# Patient Record
Sex: Female | Born: 2000 | Race: White | Hispanic: No | Marital: Single | Smoking: Never smoker
Health system: Southern US, Community
[De-identification: ages and names within clinical notes are randomized; demographics above are authoritative.]

---
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
## 2021-02-11 DIAGNOSIS — Z113 Encounter for screening for infections with a predominantly sexual mode of transmission: Secondary | ICD-10-CM | POA: Diagnosis not present

## 2021-02-11 DIAGNOSIS — Z01411 Encounter for gynecological examination (general) (routine) with abnormal findings: Secondary | ICD-10-CM | POA: Diagnosis not present

## 2021-02-11 DIAGNOSIS — Z01419 Encounter for gynecological examination (general) (routine) without abnormal findings: Secondary | ICD-10-CM | POA: Diagnosis not present

## 2021-02-11 DIAGNOSIS — N926 Irregular menstruation, unspecified: Secondary | ICD-10-CM | POA: Diagnosis not present

## 2021-02-18 DIAGNOSIS — D1801 Hemangioma of skin and subcutaneous tissue: Secondary | ICD-10-CM | POA: Diagnosis not present

## 2021-02-18 DIAGNOSIS — L980 Pyogenic granuloma: Secondary | ICD-10-CM | POA: Diagnosis not present

## 2021-02-18 DIAGNOSIS — Z23 Encounter for immunization: Secondary | ICD-10-CM | POA: Diagnosis not present

## 2021-06-25 DIAGNOSIS — F411 Generalized anxiety disorder: Secondary | ICD-10-CM | POA: Diagnosis not present

## 2021-06-26 DIAGNOSIS — Z1331 Encounter for screening for depression: Secondary | ICD-10-CM | POA: Diagnosis not present

## 2021-06-26 DIAGNOSIS — F419 Anxiety disorder, unspecified: Secondary | ICD-10-CM | POA: Diagnosis not present

## 2021-06-26 DIAGNOSIS — Z1339 Encounter for screening examination for other mental health and behavioral disorders: Secondary | ICD-10-CM | POA: Diagnosis not present

## 2021-06-30 DIAGNOSIS — F411 Generalized anxiety disorder: Secondary | ICD-10-CM | POA: Diagnosis not present

## 2021-07-09 DIAGNOSIS — F411 Generalized anxiety disorder: Secondary | ICD-10-CM | POA: Diagnosis not present

## 2021-07-16 DIAGNOSIS — F411 Generalized anxiety disorder: Secondary | ICD-10-CM | POA: Diagnosis not present

## 2021-08-29 DIAGNOSIS — F411 Generalized anxiety disorder: Secondary | ICD-10-CM | POA: Diagnosis not present

## 2021-09-08 DIAGNOSIS — F411 Generalized anxiety disorder: Secondary | ICD-10-CM | POA: Diagnosis not present

## 2021-09-15 DIAGNOSIS — F4389 Other reactions to severe stress: Secondary | ICD-10-CM | POA: Diagnosis not present

## 2021-09-15 DIAGNOSIS — F411 Generalized anxiety disorder: Secondary | ICD-10-CM | POA: Diagnosis not present

## 2021-09-22 DIAGNOSIS — F411 Generalized anxiety disorder: Secondary | ICD-10-CM | POA: Diagnosis not present

## 2021-09-22 DIAGNOSIS — F4389 Other reactions to severe stress: Secondary | ICD-10-CM | POA: Diagnosis not present

## 2021-12-01 DIAGNOSIS — F331 Major depressive disorder, recurrent, moderate: Secondary | ICD-10-CM | POA: Diagnosis not present

## 2021-12-01 DIAGNOSIS — F411 Generalized anxiety disorder: Secondary | ICD-10-CM | POA: Diagnosis not present

## 2021-12-03 DIAGNOSIS — Z23 Encounter for immunization: Secondary | ICD-10-CM | POA: Diagnosis not present

## 2022-01-07 DIAGNOSIS — F331 Major depressive disorder, recurrent, moderate: Secondary | ICD-10-CM | POA: Diagnosis not present

## 2022-01-07 DIAGNOSIS — F411 Generalized anxiety disorder: Secondary | ICD-10-CM | POA: Diagnosis not present

## 2022-01-30 DIAGNOSIS — F411 Generalized anxiety disorder: Secondary | ICD-10-CM | POA: Diagnosis not present

## 2022-01-30 DIAGNOSIS — F331 Major depressive disorder, recurrent, moderate: Secondary | ICD-10-CM | POA: Diagnosis not present

## 2022-02-13 DIAGNOSIS — F331 Major depressive disorder, recurrent, moderate: Secondary | ICD-10-CM | POA: Diagnosis not present

## 2022-02-13 DIAGNOSIS — F411 Generalized anxiety disorder: Secondary | ICD-10-CM | POA: Diagnosis not present

## 2022-03-25 DIAGNOSIS — F411 Generalized anxiety disorder: Secondary | ICD-10-CM | POA: Diagnosis not present

## 2022-04-02 DIAGNOSIS — F411 Generalized anxiety disorder: Secondary | ICD-10-CM | POA: Diagnosis not present

## 2022-04-16 DIAGNOSIS — F411 Generalized anxiety disorder: Secondary | ICD-10-CM | POA: Diagnosis not present

## 2022-04-22 DIAGNOSIS — F411 Generalized anxiety disorder: Secondary | ICD-10-CM | POA: Diagnosis not present

## 2022-04-29 DIAGNOSIS — F411 Generalized anxiety disorder: Secondary | ICD-10-CM | POA: Diagnosis not present

## 2022-05-07 DIAGNOSIS — F411 Generalized anxiety disorder: Secondary | ICD-10-CM | POA: Diagnosis not present

## 2022-05-13 DIAGNOSIS — F411 Generalized anxiety disorder: Secondary | ICD-10-CM | POA: Diagnosis not present

## 2022-05-20 DIAGNOSIS — F411 Generalized anxiety disorder: Secondary | ICD-10-CM | POA: Diagnosis not present

## 2022-05-27 DIAGNOSIS — F411 Generalized anxiety disorder: Secondary | ICD-10-CM | POA: Diagnosis not present

## 2022-06-03 DIAGNOSIS — F332 Major depressive disorder, recurrent severe without psychotic features: Secondary | ICD-10-CM | POA: Diagnosis not present

## 2022-06-03 DIAGNOSIS — F411 Generalized anxiety disorder: Secondary | ICD-10-CM | POA: Diagnosis not present

## 2022-06-10 DIAGNOSIS — F411 Generalized anxiety disorder: Secondary | ICD-10-CM | POA: Diagnosis not present

## 2022-06-17 DIAGNOSIS — F4310 Post-traumatic stress disorder, unspecified: Secondary | ICD-10-CM | POA: Diagnosis not present

## 2022-08-03 DIAGNOSIS — F332 Major depressive disorder, recurrent severe without psychotic features: Secondary | ICD-10-CM | POA: Diagnosis not present

## 2022-08-03 DIAGNOSIS — F411 Generalized anxiety disorder: Secondary | ICD-10-CM | POA: Diagnosis not present

## 2022-10-30 DIAGNOSIS — F332 Major depressive disorder, recurrent severe without psychotic features: Secondary | ICD-10-CM | POA: Diagnosis not present

## 2022-10-30 DIAGNOSIS — F411 Generalized anxiety disorder: Secondary | ICD-10-CM | POA: Diagnosis not present

## 2022-11-26 DIAGNOSIS — F411 Generalized anxiety disorder: Secondary | ICD-10-CM | POA: Diagnosis not present

## 2022-12-10 DIAGNOSIS — F411 Generalized anxiety disorder: Secondary | ICD-10-CM | POA: Diagnosis not present

## 2022-12-24 DIAGNOSIS — F411 Generalized anxiety disorder: Secondary | ICD-10-CM | POA: Diagnosis not present

## 2022-12-31 DIAGNOSIS — F4310 Post-traumatic stress disorder, unspecified: Secondary | ICD-10-CM | POA: Diagnosis not present

## 2023-01-21 DIAGNOSIS — F4310 Post-traumatic stress disorder, unspecified: Secondary | ICD-10-CM | POA: Diagnosis not present

## 2023-02-25 DIAGNOSIS — F330 Major depressive disorder, recurrent, mild: Secondary | ICD-10-CM | POA: Diagnosis not present

## 2023-02-25 DIAGNOSIS — F411 Generalized anxiety disorder: Secondary | ICD-10-CM | POA: Diagnosis not present

## 2023-05-24 DIAGNOSIS — F411 Generalized anxiety disorder: Secondary | ICD-10-CM | POA: Diagnosis not present

## 2023-05-24 DIAGNOSIS — F330 Major depressive disorder, recurrent, mild: Secondary | ICD-10-CM | POA: Diagnosis not present

## 2023-08-23 DIAGNOSIS — F330 Major depressive disorder, recurrent, mild: Secondary | ICD-10-CM | POA: Diagnosis not present

## 2023-08-23 DIAGNOSIS — F411 Generalized anxiety disorder: Secondary | ICD-10-CM | POA: Diagnosis not present

## 2023-12-27 DIAGNOSIS — F411 Generalized anxiety disorder: Secondary | ICD-10-CM | POA: Diagnosis not present

## 2023-12-27 DIAGNOSIS — F330 Major depressive disorder, recurrent, mild: Secondary | ICD-10-CM | POA: Diagnosis not present
